# Patient Record
Sex: Male | Born: 1970 | Race: Black or African American | Hispanic: No | Marital: Single | Smoking: Current every day smoker
Health system: Southern US, Community
[De-identification: ages and names within clinical notes are randomized; demographics above are authoritative.]

---

## 2015-04-27 ENCOUNTER — Emergency Department (HOSPITAL_COMMUNITY)
Admission: EM | Admit: 2015-04-27 | Discharge: 2015-04-27 | Disposition: A | Discharge status: Home / Self Care | Payer: Self-pay | Attending: Emergency Medicine | Admitting: Emergency Medicine

## 2015-04-27 ENCOUNTER — Encounter (HOSPITAL_COMMUNITY): Payer: Self-pay | Admitting: *Deleted

## 2015-04-27 ENCOUNTER — Emergency Department (HOSPITAL_COMMUNITY): Payer: Self-pay

## 2015-04-27 DIAGNOSIS — Y9289 Other specified places as the place of occurrence of the external cause: Secondary | ICD-10-CM | POA: Insufficient documentation

## 2015-04-27 DIAGNOSIS — Y998 Other external cause status: Secondary | ICD-10-CM | POA: Insufficient documentation

## 2015-04-27 DIAGNOSIS — S61212A Laceration without foreign body of right middle finger without damage to nail, initial encounter: Secondary | ICD-10-CM | POA: Insufficient documentation

## 2015-04-27 DIAGNOSIS — W292XXA Contact with other powered household machinery, initial encounter: Secondary | ICD-10-CM | POA: Insufficient documentation

## 2015-04-27 DIAGNOSIS — Y9389 Activity, other specified: Secondary | ICD-10-CM | POA: Insufficient documentation

## 2015-04-27 MED ORDER — LIDOCAINE HCL (PF) 1 % IJ SOLN
5.0000 mL | Freq: Once | INTRAMUSCULAR | Status: AC
  Administered 2015-04-27: 5 mL
  Filled 2015-04-27: qty 5

## 2015-04-27 MED ORDER — NAPROXEN 500 MG PO TABS: 500.0000 mg | ORAL_TABLET | Freq: Two times a day (BID) | ORAL | Status: AC

## 2015-04-27 MED ORDER — HYDROCODONE-ACETAMINOPHEN 5-325 MG PO TABS: 1.0000 | ORAL_TABLET | ORAL | Status: AC | PRN

## 2015-04-27 MED ORDER — HYDROCODONE-ACETAMINOPHEN 5-325 MG PO TABS
1.0000 | ORAL_TABLET | Freq: Once | ORAL | Status: AC
  Administered 2015-04-27: 1 via ORAL
  Filled 2015-04-27: qty 1

## 2015-04-27 MED ORDER — ONDANSETRON 4 MG PO TBDP
4.0000 mg | ORAL_TABLET | Freq: Once | ORAL | Status: AC
  Administered 2015-04-27: 4 mg via ORAL
  Filled 2015-04-27: qty 1

## 2015-04-27 MED ORDER — CEPHALEXIN 500 MG PO CAPS: 500.0000 mg | ORAL_CAPSULE | Freq: Three times a day (TID) | ORAL | Status: AC

## 2015-04-27 NOTE — Discharge Instructions (Signed)
You were seen in the ER today for evaluation of a cut on your right middle finger. We placed 7 stitches to help it close and heal. Please return to the ER or go to any urgent care in 10 days for re-evaluation and to remove your stitches. In the meantime I will give you prescriptions for antibiotics and pain medicine.   Take medications as prescribed. Return to the emergency room for worsening condition or new concerning symptoms. Follow up with your regular doctor. If you don't have a regular doctor use one of the numbers below to establish a primary care doctor.   Emergency Department Resource Guide 1) Find a Doctor and Pay Out of Pocket Although you won't have to find out who is covered by your insurance plan, it is a good idea to ask around and get recommendations. You will then need to call the office and see if the doctor you have chosen will accept you as a new patient and what types of options they offer for patients who are self-pay. Some doctors offer discounts or will set up payment plans for their patients who do not have insurance, but you will need to ask so you aren't surprised when you get to your appointment.  2) Contact Your Local Health Department Not all health departments have doctors that can see patients for sick visits, but many do, so it is worth a call to see if yours does. If you don't know where your local health department is, you can check in your phone book. The CDC also has a tool to help you locate your state's health department, and many state websites also have listings of all of their local health departments.  3) Find a Walk-in Clinic If your illness is not likely to be very severe or complicated, you may want to try a walk in clinic. These are popping up all over the country in pharmacies, drugstores, and shopping centers. They're usually staffed by nurse practitioners or physician assistants that have been trained to treat common illnesses and complaints. They're  usually fairly quick and inexpensive. However, if you have serious medical issues or chronic medical problems, these are probably not your best option.  No Primary Care Doctor: - Call Health Connect at  [phone] - they can help you locate a primary care doctor that  accepts your insurance, provides certain services, etc. - Physician Referral [phone]  Emergency Department Resource Guide 1) Find a Doctor and Pay Out of Pocket Although you won't have to find out who is covered by your insurance plan, it is a good idea to ask around and get recommendations. You will then need to call the office and see if the doctor you have chosen will accept you as a new patient and what types of options they offer for patients who are self-pay. Some doctors offer discounts or will set up payment plans for their patients who do not have insurance, but you will need to ask so you aren't surprised when you get to your appointment.  2) Contact Your Local Health Department Not all health departments have doctors that can see patients for sick visits, but many do, so it is worth a call to see if yours does. If you don't know where your local health department is, you can check in your phone book. The CDC also has a tool to help you locate your state's health department, and many state websites also have listings of all of their local health departments.  3) Find a Walk-in Clinic If your illness is not likely to be very severe or complicated, you may want to try a walk in clinic. These are popping up all over the country in pharmacies, drugstores, and shopping centers. They're usually staffed by nurse practitioners or physician assistants that have been trained to treat common illnesses and complaints. They're usually fairly quick and inexpensive. However, if you have serious medical issues or chronic medical problems, these are probably not your best option.  No Primary Care Doctor: - Call Health Connect at   [phone] - they can help you locate a primary care doctor that  accepts your insurance, provides certain services, etc. - Physician Referral Service- [phone]  Chronic Pain Problems: Organization         Address  Phone   Notes  [facility]  [phone] Patients need to be referred by their primary care doctor.   Medication Assistance: Organization         Address  Phone   Notes  [facility] Medication [facility] [address] [place], [place] [address] [phone] --Must be a resident of [facility] -- Must have NO insurance coverage whatsoever (no Medicaid/ Medicare, etc.) -- The pt. MUST have a primary care doctor that directs their care regularly and follows them in the community   [facility]  [phone]   [employer]  [phone]    Agencies that provide inexpensive medical care: Organization         Address  Phone   Notes  [facility]  [phone]   [facility]    [phone]   [facility] [address] [place], [place] [address] [phone]   [facility] [address] [place] [place] [phone]   [facility]    [phone]   [facility]    [phone]   [facility]  [address], [place] Phone:  [phone], Fax:  [phone] Hours of Operation:  9 am - 6 pm, M-F.  Also accepts Medicaid/Medicare and self-pay.  [facility]  [address], [place] Phone: [phone], Fax: [phone]. Hours of Operation:  8:30 am - 5:30 pm, M-F.  Also accepts Medicaid and self-pay.  [facility] [place] [address] [place] Phone: [phone]   [facility] [address] Natasha Bence [place], [place] [phone], Ext. [phone] Mondays & Thursdays: 7-9 AM.  First 15 patients are seen on a first come, first serve basis.     Medicaid-accepting [facility] Providers:  Organization         Address  Phone   Notes  [facility] [address],  [phone] Also accepts self-pay patients.  [facility] [address] Laurell Josephs [place], [place]  [phone]   [facility] [address], [place] [phone]   [facility] [address] [place] [phone]   Renaye Rakers [address], [place]   [phone] Only accepts [place] patients after they have their name applied to their card.   Self-Pay (no insurance) in [facility]:  Organization         Address  Phone   Notes  Sickle Cell Patients, [facility] [phone]  Vaughan Basta [place], [place] [phone]   [facility] [address] [place], [place] [phone]   [facility]  [address],  [phone]   [facility]/Dr. Osei-Bonsu  [address], [place] or [address], [place] [phone] Phone number for both [place] and [place] locations is the same.  [facility] [address] [place] [phone]   [facility] [address] [place] or [address] [phone]   [facility] [address], [place] [phone], phone; [phone], fax Sees patients 1st and 3rd Saturday of every month.  Must not qualify for public or private insurance (i.e. Medicaid, Medicare, [place] Health Choice, Veterans' Benefits)  Household income should be no more than 200% of the poverty level The clinic cannot treat you if you are pregnant or think you are pregnant  Sexually transmitted diseases are not treated at the clinic.     Laceration Care, Adult A laceration is a cut that goes through all of the  layers of the skin and into the tissue that is right under the skin. Some lacerations heal on their own. Others need to be closed with stitches (sutures), staples, skin adhesive strips, or skin glue. Proper laceration care minimizes the risk of infection and helps the laceration to heal better. HOW TO CARE FOR YOUR LACERATION If sutures or staples were used:  Keep the wound clean and dry.  If you were given a bandage (dressing), you should change it at least one time per day or as told by your health care provider. You should also change it if it becomes wet or dirty.  Keep the wound completely dry for the first 24 hours or as told by your health care provider. After that time, you may shower or bathe. However, make sure that the wound is not soaked in water until after the sutures or staples have been removed.  Clean the wound one time each day or as told by your health care provider:  Wash the wound with soap and water.  Rinse the wound with water to remove all soap.  Pat the wound dry with a clean towel. Do not rub the wound.  After cleaning the wound, apply a thin layer of antibiotic ointmentas told by your health care provider. This will help to prevent infection and keep the dressing from sticking to the wound.  Have the sutures or staples removed as told by your health care provider. If skin adhesive strips were used:  Keep the wound clean and dry.  If you were given a bandage (dressing), you should change it at least one time per day or as told by your health care provider. You should also change it if it becomes dirty or wet.  Do not get the skin adhesive strips wet. You may shower or bathe, but be careful to keep the wound dry.  If the wound gets wet, pat it dry with a clean towel. Do not rub the wound.  Skin adhesive strips fall off on their own. You may trim the strips as the wound heals. Do not remove skin adhesive strips that are still stuck to the wound. They will fall  off in time. If skin glue was used:  Try to keep the wound dry, but you may briefly wet it in the shower or bath.  Do not soak the wound in water, such as by swimming.  After you have showered or bathed, gently pat the wound dry with a clean towel. Do not rub the wound.  Do not do any activities that will make you sweat heavily until the skin glue has fallen off on its own.  Do not apply liquid, cream, or ointment medicine to the wound while the skin glue is in place. Using those may loosen the film before the wound has healed.  If you were given a bandage (dressing), you should change it at least one time per day or as told by your health care provider. You should also change it if it becomes dirty or wet.  If a dressing is placed over the wound, be careful not to apply tape directly over the skin glue. Doing that may cause the glue to be pulled off before the wound has healed.  Do not pick at the glue. The skin glue usually remains in place for 5-10 days, then it falls off of the skin. General Instructions  Take over-the-counter and prescription medicines only as told by your health care provider.  If you were prescribed an antibiotic medicine or ointment, take or apply it as told by your doctor. Do not stop using it even if your condition improves.  To help prevent scarring, make sure to cover your wound with sunscreen whenever you are outside after stitches are removed, after adhesive strips are removed, or when glue remains in place and the wound is healed. Make sure to wear a sunscreen of at least 30 SPF.  Do not scratch or pick at the wound.  Keep all follow-up visits as told by your health care provider. This is important.  Check your wound every day for signs of infection. Watch for:  Redness, swelling, or pain.  Fluid, blood, or pus.  Raise (elevate) the injured area above the level of your heart while you are sitting or lying down, if possible. SEEK MEDICAL CARE  IF:  You received a tetanus shot and you have swelling, severe pain, redness, or bleeding at the injection site.  You have a fever.  A wound that was closed breaks open.  You notice a bad smell coming from your wound or your dressing.  You notice something coming out of the wound, such as wood or glass.  Your pain is not controlled with medicine.  You have increased redness, swelling, or pain at the site of your wound.  You have fluid, blood, or pus coming from your wound.  You notice a change in the color of your skin near your wound.  You need to change the dressing frequently due to fluid, blood, or pus draining from the wound.  You develop a new rash.  You develop numbness around the wound. SEEK IMMEDIATE MEDICAL CARE IF:  You develop severe swelling around the wound.  Your pain suddenly increases and is severe.  You develop painful lumps near the wound or on skin that is anywhere on your body.  You have a red streak going away from your wound.  The wound is on your hand or foot and you cannot properly move a finger or toe.  The wound is on your hand or foot and you notice that your fingers or toes look pale or bluish.   This information is not intended to replace advice given to you by your health care provider. Make sure you discuss any questions you have with your health care  provider.   Document Released: 02/19/2005 Document Revised: 07/06/2014 Document Reviewed: 02/15/2014 Elsevier Interactive Patient Education [employer]

## 2015-04-27 NOTE — ED Provider Notes (Signed)
CSN: [id number]     Arrival date & time 04/27/15  1613 History  By signing my name below, I, Evon Slack, attest that this documentation has been prepared under the direction and in the presence of Sarenity Ramaker Y Karema Tocci, [place]. Electronically Signed: Evon Slack, ED Scribe. 04/27/2015. 4:39 PM.     Chief Complaint  Patient presents with  . Finger Injury   The history is provided by the patient. No language interpreter was used.   HPI Comments: Kyle Brown is a 45 y.o. male who presents to the Emergency Department complaining of new right middle finger injury onset PTA. Pt rates the severity of his pain 10/10. Pt states that he was moving a washer and dryer and cut the the middle finger by accident. Pt denies any medications PTA. Pt denies numbness or weakness. States he is still able to move his fingers freely. States he did not wash the wound out at all. Denies anti coagulation use. Last Tdap within one year.    No past medical history on file. No past surgical history on file. No family history on file. Social History  Substance Use Topics  . Smoking status: Not on file  . Smokeless tobacco: Not on file  . Alcohol Use: Not on file    Review of Systems  Skin: Positive for wound (right middle finger).  Neurological: Negative for numbness.  All other systems reviewed and are negative.    Allergies  Review of patient's allergies indicates not on file.  Home Medications   Prior to Admission medications   Medication Sig Start Date End Date Taking? Authorizing Provider  cephALEXin (KEFLEX) 500 MG capsule Take 1 capsule (500 mg total) by mouth 3 (three) times daily. 04/27/15   Carlene Coria, PA-C  HYDROcodone-acetaminophen (NORCO/VICODIN) 5-325 MG tablet Take 1 tablet by mouth every 4 (four) hours as needed. 04/27/15   Ace Gins Laniyah Rosenwald, PA-C  naproxen (NAPROSYN) 500 MG tablet Take 1 tablet (500 mg total) by mouth 2 (two) times daily. 04/27/15   Ace Gins Abbye Lao, PA-C   BP 125/74 mmHg  Pulse  94  Temp(Src) 98 F (36.7 C) (Oral)  Resp 19  Ht  (1.778 m)  Wt 170 lb (77.111 kg)  BMI 24.39 kg/m2  SpO2 96%   Physical Exam  Constitutional: He is oriented to person, place, and time. He appears well-developed and well-nourished. No distress.  HENT:  Head: Normocephalic and atraumatic.  Eyes: Conjunctivae and EOM are normal.  Neck: Neck supple. No tracheal deviation present.  Cardiovascular: Normal rate.   Pulmonary/Chest: Effort normal. No respiratory distress.  Musculoskeletal: Normal range of motion.  2.5 cm transverse laceration to middle segment of right middle finger on palmar aspect. Does not involve knuckle. Bleeding is well controlled. No foreign bodies visualized or palpated. Cap refill < 3 sec. Intact distal sensation. Intact strength and ROM at DIP, PIP, MCP.  Neurological: He is alert and oriented to person, place, and time.  Skin: Skin is warm and dry.  Psychiatric: He has a normal mood and affect. His behavior is normal.  Nursing note and vitals reviewed.  Filed Vitals:   04/27/15 1628 04/27/15 1825  BP: 125/74 112/72  Pulse: 94 91  Temp: 98 F (36.7 C) 97.8 F (36.6 C)  TempSrc: Oral Oral  Resp: 19 18  Height:  (1.778 m)   Weight: 77.111 kg   SpO2: 96% 97%     ED Course  Procedures (including critical care time) LACERATION REPAIR Performed by:  Noelle Penner Authorized by: Noelle Penner Consent: Verbal consent obtained. Risks and benefits: risks, benefits and alternatives were discussed Consent given by: patient Patient identity confirmed: provided demographic data Prepped and Draped in normal sterile fashion Wound explored  Laceration Location: right middle finger  Laceration Length: 2.5cm  No Foreign Bodies seen or palpated  Anesthesia: digital block and local infiltration  Local anesthetic: lidocaine 1% without epinephrine  Anesthetic total: 7 ml  Irrigation method: syringe Amount of cleaning: standard  Skin closure: 5-0  prolene  Number of sutures: 7  Technique: simple interrupted  Patient tolerance: Patient tolerated the procedure well with no immediate complications.   DIAGNOSTIC STUDIES: Oxygen Saturation is 96% on RA, normal by my interpretation.    COORDINATION OF CARE: 4:37 PM-Discussed treatment plan with pt at bedside and pt agreed to plan.     Labs Review Labs Reviewed - No data to display  Imaging Review Dg Hand Complete Right  04/27/2015  CLINICAL DATA:  Caught right middle finger on anterior aspect near PIP joint EXAM: RIGHT HAND - COMPLETE 3+ VIEW COMPARISON:  None. FINDINGS: There is no evidence of fracture or dislocation. There is no evidence of arthropathy or other focal bone abnormality. Soft tissues are unremarkable. IMPRESSION: Negative. Electronically Signed   By: Esperanza Heir M.D.   On: 04/27/2015 16:55      EKG Interpretation None      MDM   Final diagnoses:  Laceration of middle finger of right hand without complication, initial encounter   Pt is an 45 y.o. male with no significant PMH who presents to the ED for evaluation of laceration to his right middle finger. He is neurovascularly intact, with intact strength and sensation. I visualized to the bottom of the wound in a bloodless field. No foreign bodies. Good approximation. Pt tolerated lac repair well. Discussed care instructions and instruct pt to return to the ED or go to [facility] in ~10 days for wound check and suture removal. Given location of lac (hand) will cover with few days of antibiotics. Rx also given for pain meds. Pt otherwise nontoxic, with bleeding and pain well controlled. Resource guide given to establish PCP for f/u as well. ER return precautions given.    I personally performed the services described in this documentation, which was scribed in my presence. The recorded information has been reviewed and is accurate.     Carlene Coria, PA-C 04/27/15 1841  Derwood Kaplan, MD 04/28/15 [phone]

## 2015-04-27 NOTE — ED Notes (Signed)
Pt reports he cut his Rt middle finger about 1 hr. Ago moving a washer and dryer.

## 2015-04-27 NOTE — ED Notes (Signed)
Declined W/C at D/C and was escorted to lobby by RN. 

## 2016-07-02 IMAGING — DX DG HAND COMPLETE 3+V*R*
3 series · 3 of 3 positions shown · non-contrast
Comparison: None.

CLINICAL DATA: Caught right middle finger on anterior aspect near
PIP joint

EXAM:
RIGHT HAND - COMPLETE 3+ VIEW

[hand pa]
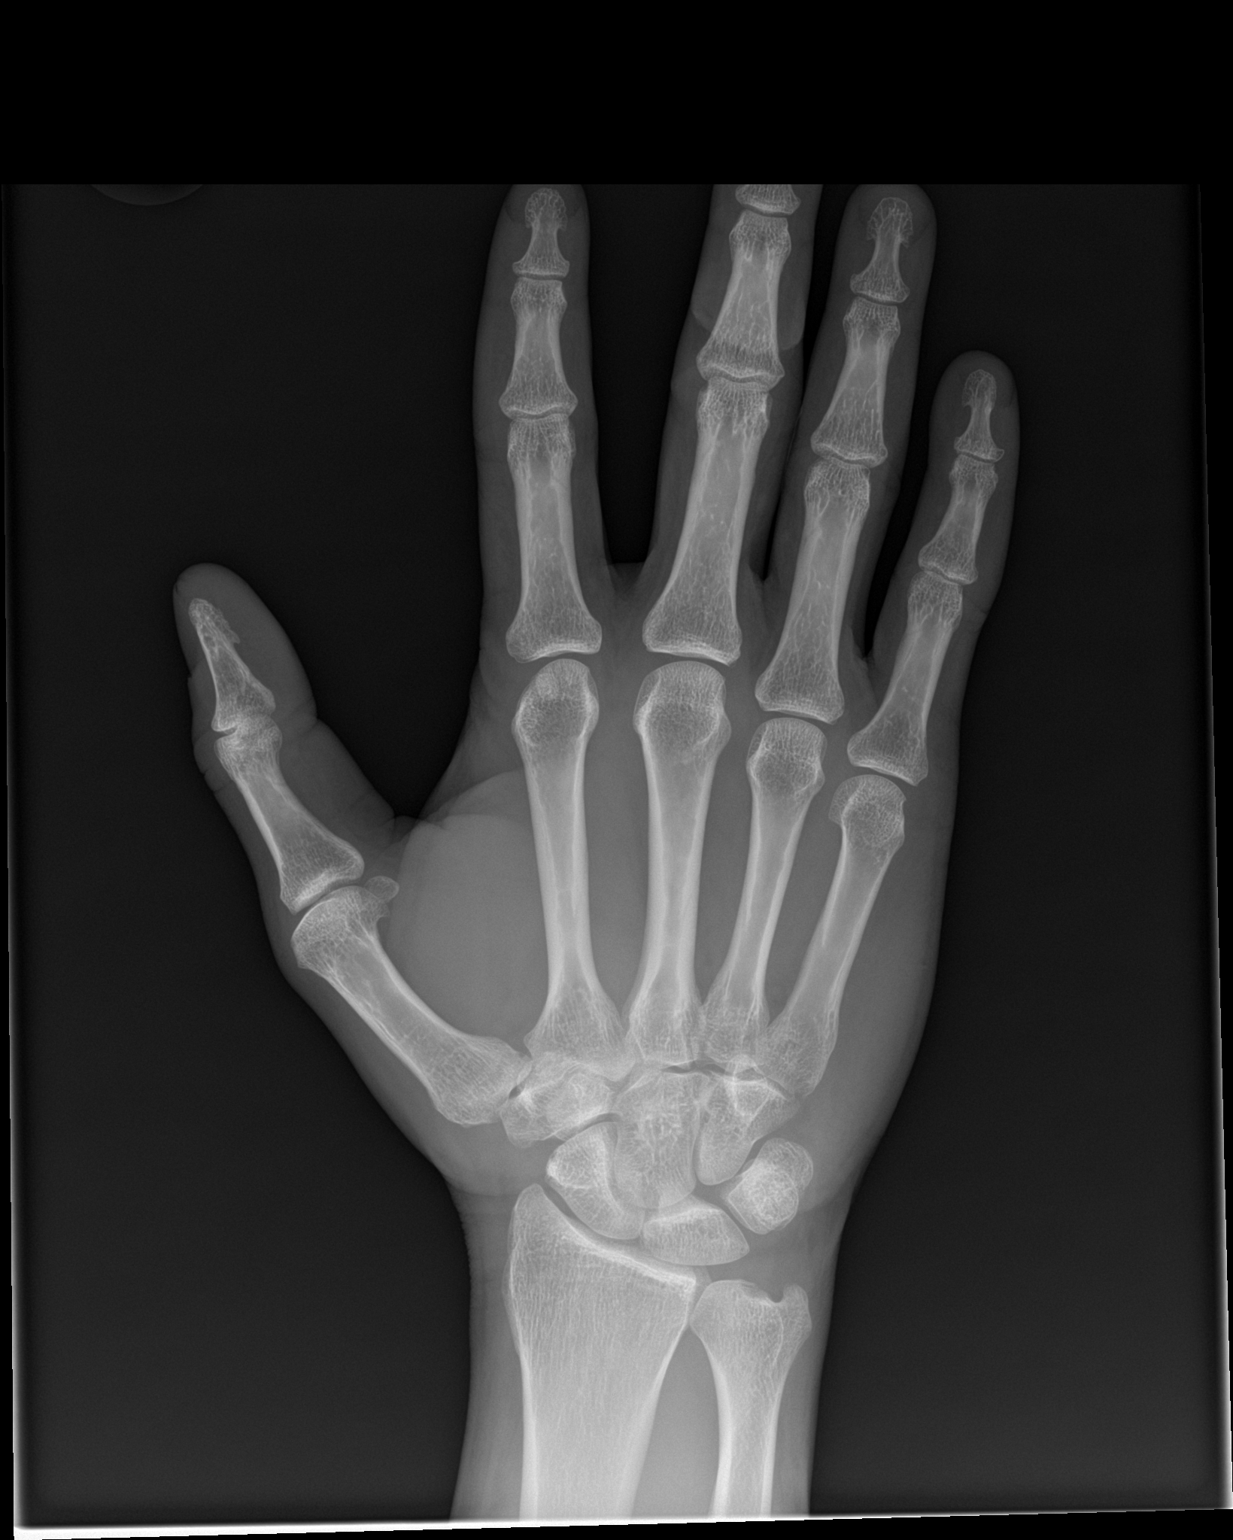

[hand obl]
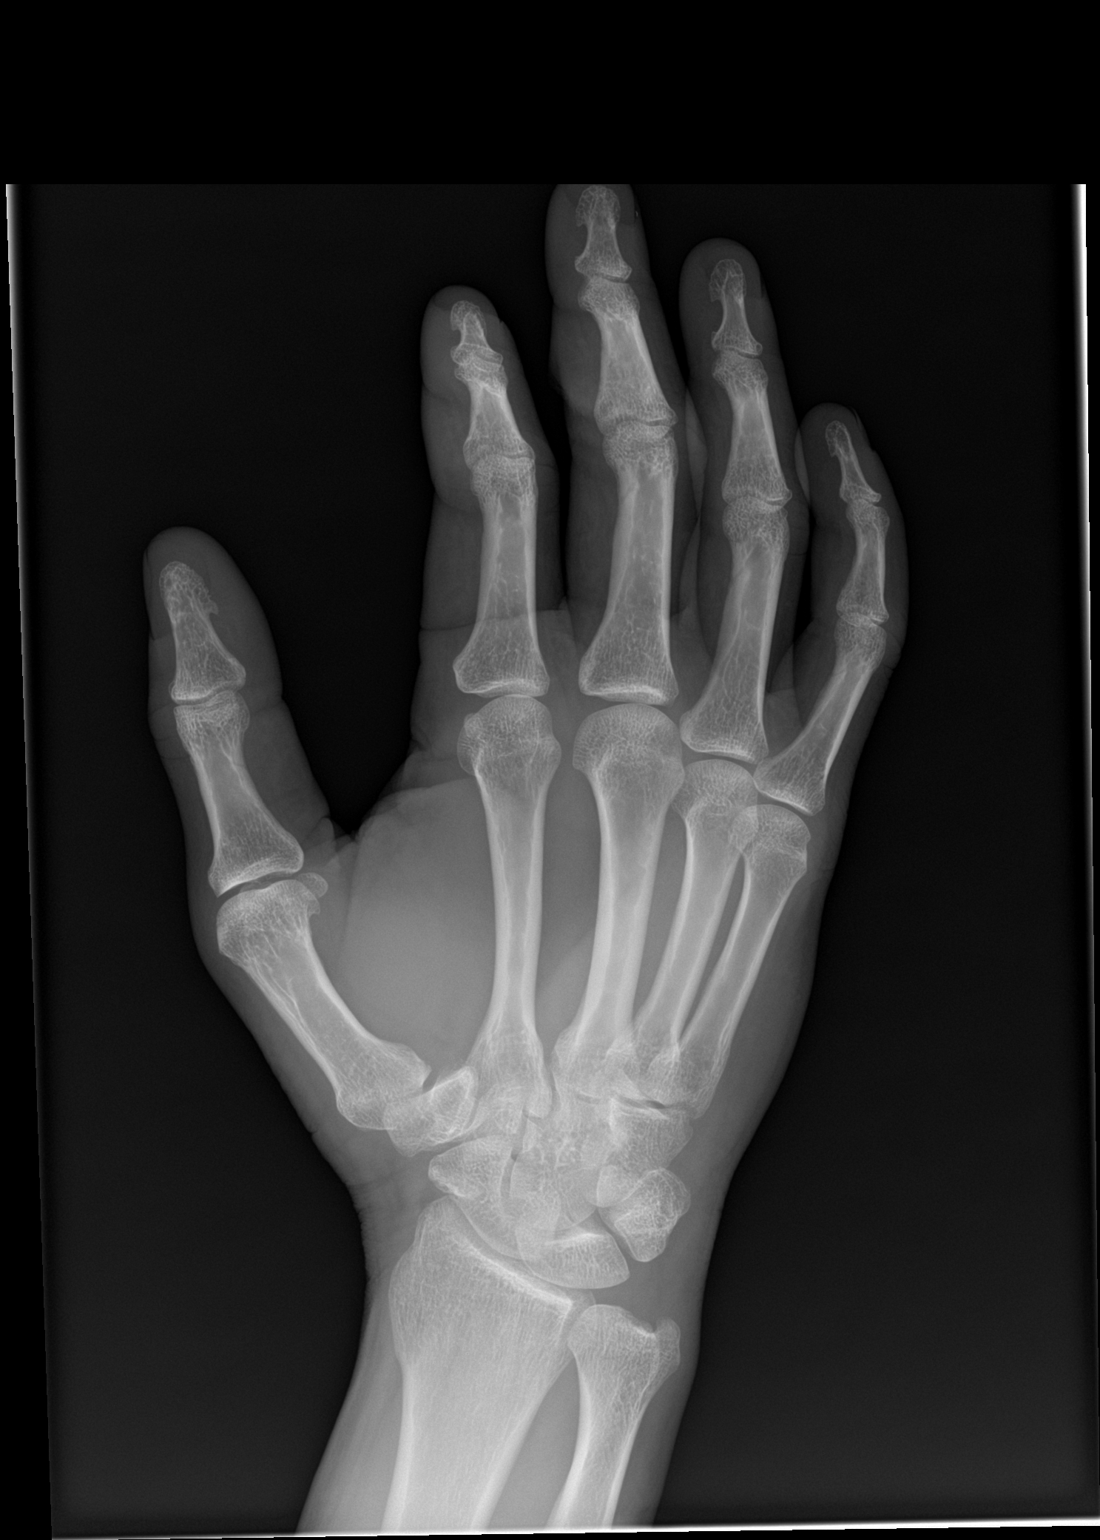

[hand lat]
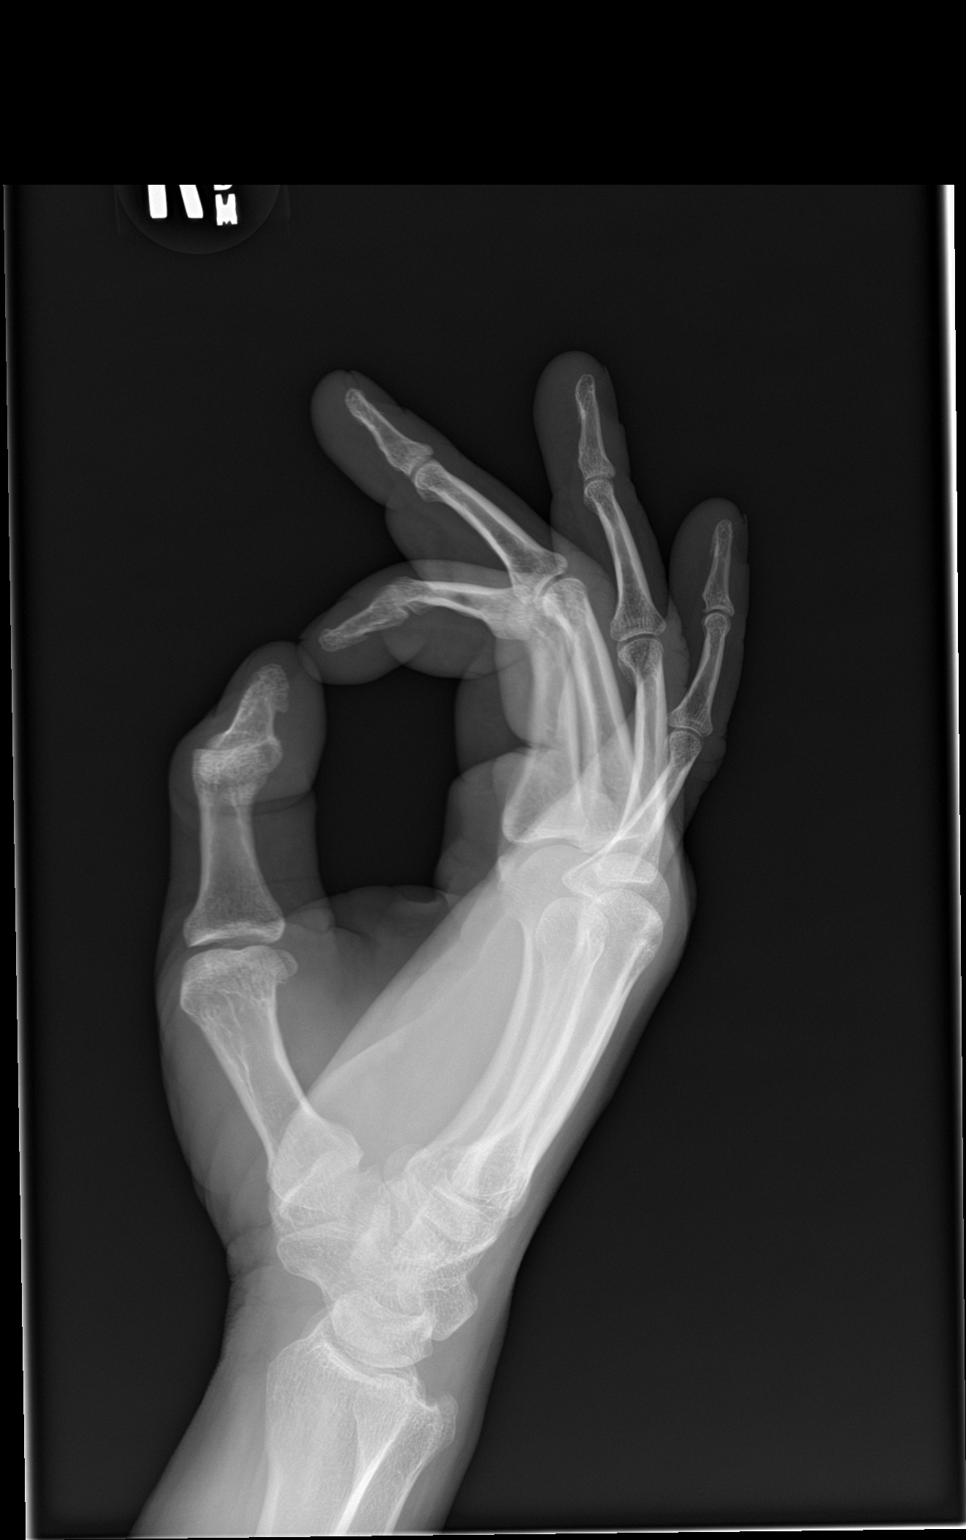

[3 of 3 positions shown; findings below may reference images not displayed]

FINDINGS: There is no evidence of fracture or dislocation. There is no
evidence of arthropathy or other focal bone abnormality. Soft
tissues are unremarkable.
IMPRESSION: Negative.
